# Patient Record
Sex: Female | Born: 1995 | Race: Black or African American | Hispanic: No | Marital: Single | Smoking: Never smoker
Health system: Southern US, Community
[De-identification: ages and names within clinical notes are randomized; demographics above are authoritative.]

## PROBLEM LIST (undated history)

## (undated) ENCOUNTER — Emergency Department (HOSPITAL_COMMUNITY): Payer: Self-pay | Source: Home / Self Care

## (undated) DIAGNOSIS — N83209 Unspecified ovarian cyst, unspecified side: Secondary | ICD-10-CM

## (undated) DIAGNOSIS — Z87440 Personal history of urinary (tract) infections: Secondary | ICD-10-CM

## (undated) HISTORY — PX: NO PAST SURGERIES: SHX2092

---
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
## 2015-03-10 ENCOUNTER — Encounter: Payer: Self-pay | Admitting: Obstetrics & Gynecology

## 2015-03-10 ENCOUNTER — Ambulatory Visit (INDEPENDENT_AMBULATORY_CARE_PROVIDER_SITE_OTHER): Payer: BLUE CROSS/BLUE SHIELD | Admitting: Obstetrics & Gynecology

## 2015-03-10 VITALS — BP 120/73 | HR 75 | Temp 98.4°F | Wt 111.6 lb

## 2015-03-10 DIAGNOSIS — Q6100 Congenital renal cyst, unspecified: Secondary | ICD-10-CM | POA: Diagnosis not present

## 2015-03-10 DIAGNOSIS — D500 Iron deficiency anemia secondary to blood loss (chronic): Secondary | ICD-10-CM

## 2015-03-10 DIAGNOSIS — N281 Cyst of kidney, acquired: Secondary | ICD-10-CM

## 2015-03-10 LAB — CBC
HEMATOCRIT: 31.9 % — AB (ref 36.0–46.0)
HEMOGLOBIN: 10 g/dL — AB (ref 12.0–15.0)
MCH: 26.2 pg (ref 26.0–34.0)
MCHC: 31.3 g/dL (ref 30.0–36.0)
MCV: 83.5 fL (ref 78.0–100.0)
MPV: 8.8 fL (ref 8.6–12.4)
PLATELETS: 438 10*3/uL — AB (ref 150–400)
RBC: 3.82 MIL/uL — ABNORMAL LOW (ref 3.87–5.11)
RDW: 14.9 % (ref 11.5–15.5)
WBC: 5.6 10*3/uL (ref 4.0–10.5)

## 2015-03-10 LAB — BASIC METABOLIC PANEL
BUN: 12 mg/dL (ref 6–23)
CALCIUM: 9.1 mg/dL (ref 8.4–10.5)
CO2: 23 meq/L (ref 19–32)
Chloride: 106 mEq/L (ref 96–112)
Creat: 0.76 mg/dL (ref 0.50–1.10)
Glucose, Bld: 99 mg/dL (ref 70–99)
Potassium: 4.7 mEq/L (ref 3.5–5.3)
SODIUM: 139 meq/L (ref 135–145)

## 2015-03-10 NOTE — Progress Notes (Signed)
Subjective:     Patient ID: Deborah Swanson, female   DOB: 03/22/1996, 19 y.o.   MRN: [id number]  HPI Pt reports that she is here as a post surgery f/u. She denies problems. She reports occ pain with menses but, has no other pain.  She is back to full activitiy.  She also had questions re a renal cyst that was dx'd in Oct of 2014 that was not followed up.   Review of Systems     Objective:   Physical ExamPt in NAD BP 120/73 mmHg  Pulse 75  Temp(Src) 98.4 F (36.9 C) (Oral)  Wt 111 lb 9.6 oz (50.621 kg)  LMP 02/13/2015  Abd: soft, NT; ND.  Well healed transverse incision.    10/11/2013 CLINICAL DATA: Right lower quadrant abdominal pain.  EXAM: CT ABDOMEN AND PELVIS WITH CONTRAST  TECHNIQUE: Multidetector CT imaging of the abdomen and pelvis was performed using the standard protocol following bolus administration of intravenous contrast.  CONTRAST: 50mL OMNIPAQUE IOHEXOL 300 MG/ML SOLN, 100mL OMNIPAQUE IOHEXOL 300 MG/ML SOLN  COMPARISON: None.  FINDINGS: The lung bases are clear. No pleural effusion.  The solid abdominal organs are unremarkable. There is a complex cyst associated with the upper pole region of the left kidney.  The stomach, duodenum, small bowel and colon are unremarkable. The appendix is normal. No mesenteric or retroperitoneal mass or adenopathy. The aorta and branch vessels are normal.  There is a large cystic lesion in the right adnexal area extending well appendage the abdomen. There are areas of enhancing soft tissue density and some septations. This also a large cyst likely arising from the left ovary which appears more simple. The uterus is displaced to the left but is otherwise unremarkable. The endometrium is slightly thickened but this may be due to secretory phase of ovulation. There is a small amount of free pelvic fluid and also fluid in the right paracolic gutter.  The bony structures are  unremarkable.  IMPRESSION: Large (12 x 10 cm) complex cystic lesion associated with the right ovary. This could be a cystadenoma or cystadenocarcinoma.  5 cm simple appearing cyst associated with the left ovary.  Small to moderate amount of free pelvic fluid.  No obvious omental or peritoneal surface disease.  Complex cyst associated with the left ovary. Ultrasound followup suggested.   10/21/2014CLINICAL DATA: Right lower quadrant pelvic pain and mass on prior exam  EXAM: TRANSVAGINAL ULTRASOUND OF PELVIS  DOPPLER ULTRASOUND OF OVARIES  TECHNIQUE: Transvaginal ultrasound examination of the pelvis was performed including evaluation of the uterus, ovaries, adnexal regions, and pelvic cul-de-sac.  Color and duplex Doppler ultrasound was utilized to evaluate blood flow to the ovaries.  COMPARISON: CT and pelvic ultrasound 10/10/2013  FINDINGS: Uterus  Measurements: 9.7 x 4.5 x 4.2 cm. No fibroids or other mass visualized.  Endometrium  Thickness: 1.2 cm. No focal abnormality visualized.  Right ovary  Measurements: No ovarian tissue is identifiable separate from a large right adnexal mass measuring 12.2 x 7.4 x 7.0 cm, as previously seen. There is low resistance arterial waveform identifiable within this mass but venous flow is not identifiable separately. The right ovary is not identifiable separate from this mass.  Left ovary  Measurements: 7.0 x 5.1 x 6.2 cm. Probable hemorrhagic cyst with retractile internal clot measuring 5.1 x 5.1 x 4.5 cm.  Pulsed Doppler evaluation demonstrates normal low resistance arterial and venous waveforms in the left ovary.  Moderate free fluid is noted within the pelvis and right paracolic  gutter.  IMPRESSION: Large right adnexal mass without separately identifiable ovary is reidentified. This could represent primary ovarian malignancy such as malignant dermoid, sarcoma, or less likely fibroma or  thecoma. Internal low resistance arterial waveform is detectable but this could represent neovascularity associated with tumor. Ovarian torsion is possible based on the appearance (ovarian enlargement itself may be a feature of torsion) although the appearance is more visually suggestive of a mass and no peripheral follicles are identified.  Probable left ovarian hemorrhagic cyst. Followup is as per the patient's overall clinical course and recommended in 6-8 weeks with pelvic ultrasound from less additional intervention is performed.  Surgical consultation is recommended for the right adnexal mass. These results were called by telephone at the time of interpretation on 10/13/2013 at 11:07 AM to nurse [name] in the [facility], while the nurse practitioner is temporarily unavailable, who verbally acknowledged these results.  No sonographic evidence for left ovarian torsion. .    Assessment:     Pt for 1 year f/u of LSO.  She is currently sx free.  She had a renal complex cyst on her left kidney that was never eval.  Will obtain renal sono and if abnormal will refer as appropriate.         Plan:     Renal sono F/u in 1 year or sooner prn results of renal sono.

## 2015-03-10 NOTE — Patient Instructions (Signed)
Anemia, Nonspecific Anemia is a condition in which the concentration of red blood cells or hemoglobin in the blood is below normal. Hemoglobin is a substance in red blood cells that carries oxygen to the tissues of the body. Anemia results in not enough oxygen reaching these tissues.  CAUSES  Common causes of anemia include:   Excessive bleeding. Bleeding may be internal or external. This includes excessive bleeding from periods (in women) or from the intestine.   Poor nutrition.   Chronic kidney, thyroid, and liver disease.  Bone marrow disorders that decrease red blood cell production.  Cancer and treatments for cancer.  HIV, AIDS, and their treatments.  Spleen problems that increase red blood cell destruction.  Blood disorders.  Excess destruction of red blood cells due to infection, medicines, and autoimmune disorders. SIGNS AND SYMPTOMS   Minor weakness.   Dizziness.   Headache.  Palpitations.   Shortness of breath, especially with exercise.   Paleness.  Cold sensitivity.  Indigestion.  Nausea.  Difficulty sleeping.  Difficulty concentrating. Symptoms may occur suddenly or they may develop slowly.  DIAGNOSIS  Additional blood tests are often needed. These help your health care provider determine the best treatment. Your health care provider will check your stool for blood and look for other causes of blood loss.  TREATMENT  Treatment varies depending on the cause of the anemia. Treatment can include:   Supplements of iron, vitamin B12, or folic acid.   Hormone medicines.   A blood transfusion. This may be needed if blood loss is severe.   Hospitalization. This may be needed if there is significant continual blood loss.   Dietary changes.  Spleen removal. HOME CARE INSTRUCTIONS Keep all follow-up appointments. It often takes many weeks to correct anemia, and having your health care provider check on your condition and your response to  treatment is very important. SEEK IMMEDIATE MEDICAL CARE IF:   You develop extreme weakness, shortness of breath, or chest pain.   You become dizzy or have trouble concentrating.  You develop heavy vaginal bleeding.   You develop a rash.   You have bloody or black, tarry stools.   You faint.   You vomit up blood.   You vomit repeatedly.   You have abdominal pain.  You have a fever or persistent symptoms for more than 2-3 days.   You have a fever and your symptoms suddenly get worse.   You are dehydrated.  MAKE SURE YOU:  Understand these instructions.  Will watch your condition.  Will get help right away if you are not doing well or get worse. Document Released: 01/17/2005 Document Revised: 08/12/2013 Document Reviewed: 06/05/2013 ExitCare Patient Information 2015 [employer]. This information is not intended to replace advice given to you by your health care provider. Make sure you discuss any questions you have with your health care provider.  

## 2015-03-14 ENCOUNTER — Telehealth: Payer: Self-pay | Admitting: *Deleted

## 2015-03-14 NOTE — Telephone Encounter (Signed)
Attempted to contact patient, no answer, left message for patient to contact the clinic or to indicate that we can leave confidential information on her voicemail.

## 2015-03-14 NOTE — Telephone Encounter (Signed)
-----   Message from Willodean Rosenthalarolyn Harraway-Smith, MD sent at 03/14/2015  9:27 AM EDT ----- Please notify pt that she should be taking FeSO4 OTC bid for anemia.  Thanks, clh-S

## 2015-03-15 NOTE — Telephone Encounter (Signed)
Called patient and informed her of continued low iron levels and to start taking iron twice a day and also reminded patient of appt on 3/24. Patient verbalized understanding to all and had no questions

## 2015-03-17 ENCOUNTER — Ambulatory Visit (HOSPITAL_COMMUNITY)
Admission: RE | Admit: 2015-03-17 | Discharge: 2015-03-17 | Disposition: A | Discharge status: Home / Self Care | Payer: BLUE CROSS/BLUE SHIELD | Source: Ambulatory Visit | Attending: Obstetrics & Gynecology | Admitting: Obstetrics & Gynecology

## 2015-03-17 DIAGNOSIS — N281 Cyst of kidney, acquired: Secondary | ICD-10-CM

## 2015-03-17 DIAGNOSIS — Q6100 Congenital renal cyst, unspecified: Secondary | ICD-10-CM | POA: Diagnosis present

## 2015-03-21 ENCOUNTER — Telehealth: Payer: Self-pay | Admitting: *Deleted

## 2015-03-21 NOTE — Telephone Encounter (Signed)
Called patient and left message to call us back for some results.

## 2015-03-21 NOTE — Telephone Encounter (Signed)
-----   Message from Willodean Rosenthalarolyn Harraway-Smith, MD sent at 03/17/2015  4:56 PM EDT ----- Please call pt.  Her renal sono was normal.  No cyst seen.  Thx, clh-S

## 2015-03-21 NOTE — Telephone Encounter (Signed)
Pt returned call and I informed her or results. She had no further questions.

## 2016-08-04 IMAGING — US US RENAL
1 series · 14 of 25 positions shown · non-contrast
Comparison: CT abdomen pelvis dated 10/10/2013

CLINICAL DATA: Follow-up left renal cysts

EXAM:
RENAL/URINARY TRACT ULTRASOUND COMPLETE

[Series 1: us renal · 14 of 61 slices shown]
[im 1/61]
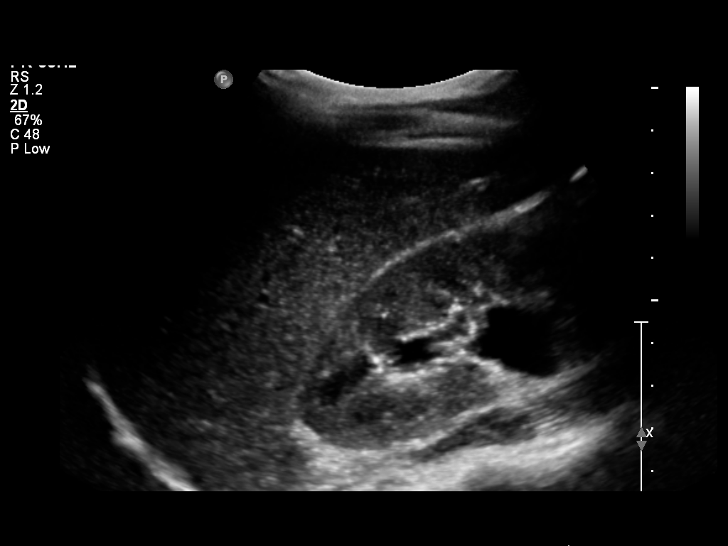
[im 6/61]
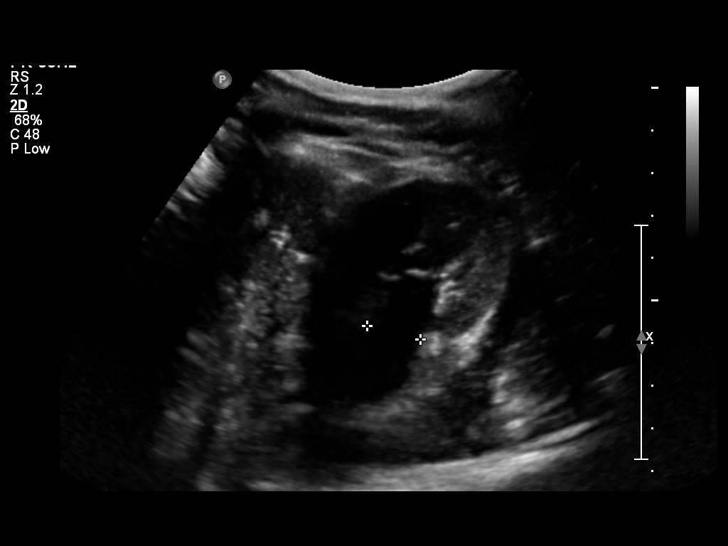
[im 11/61]
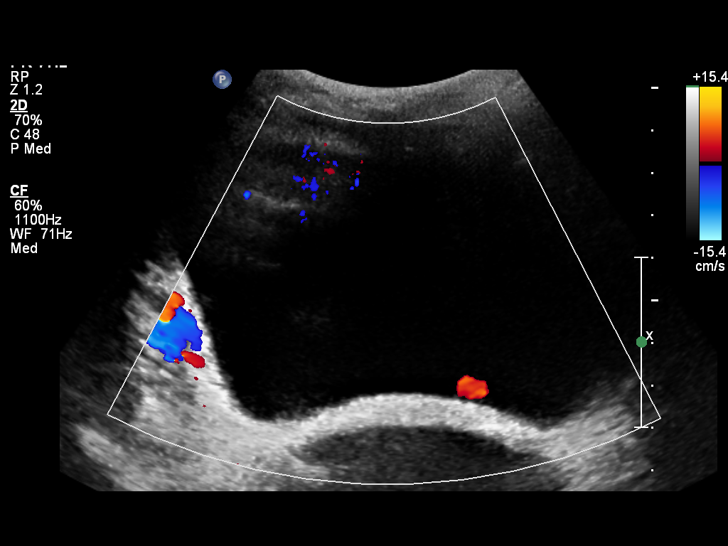
[im 16/61]
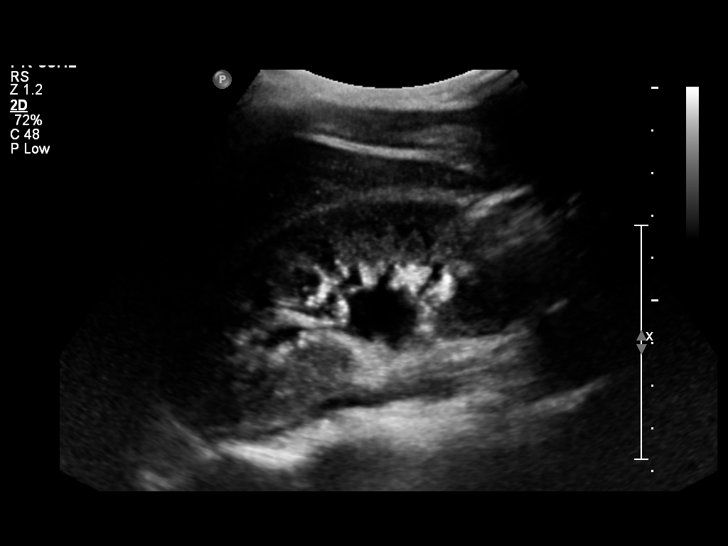
[im 21/61]
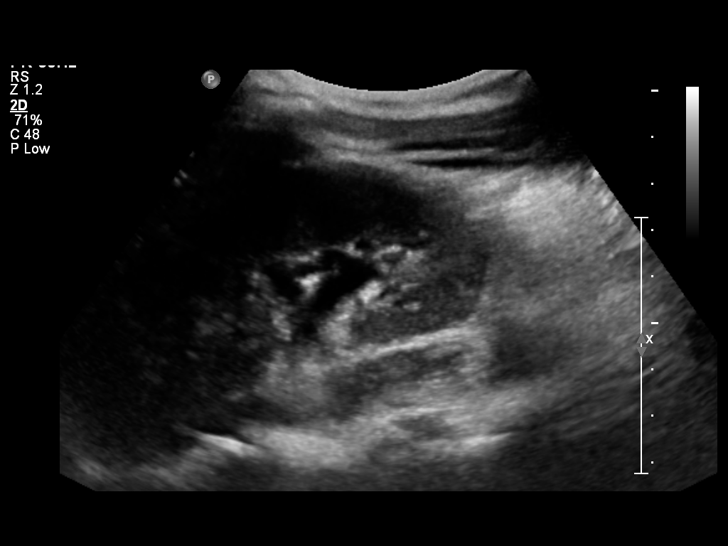
[im 23/61]
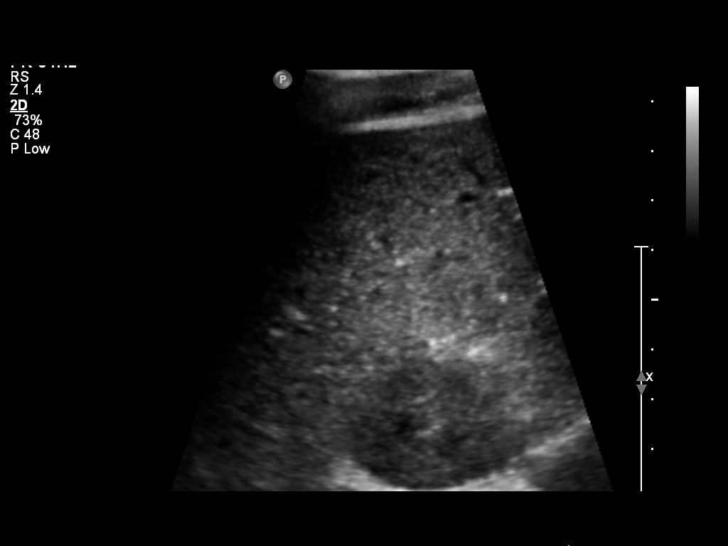
[im 28/61]
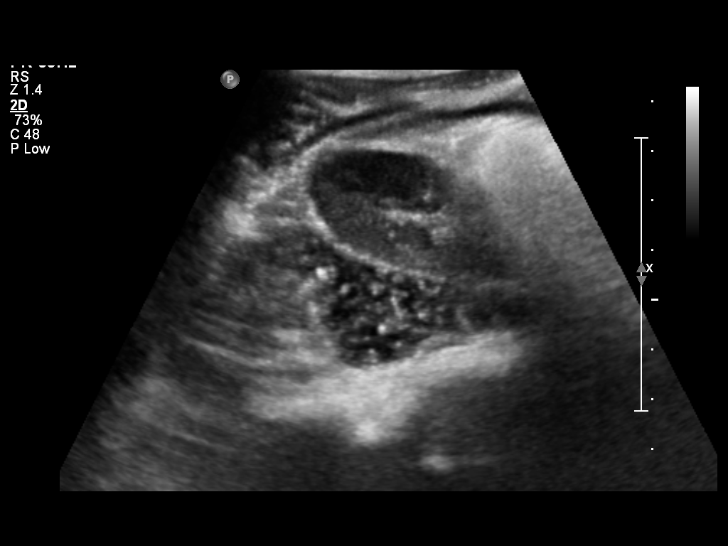
[im 33/61]
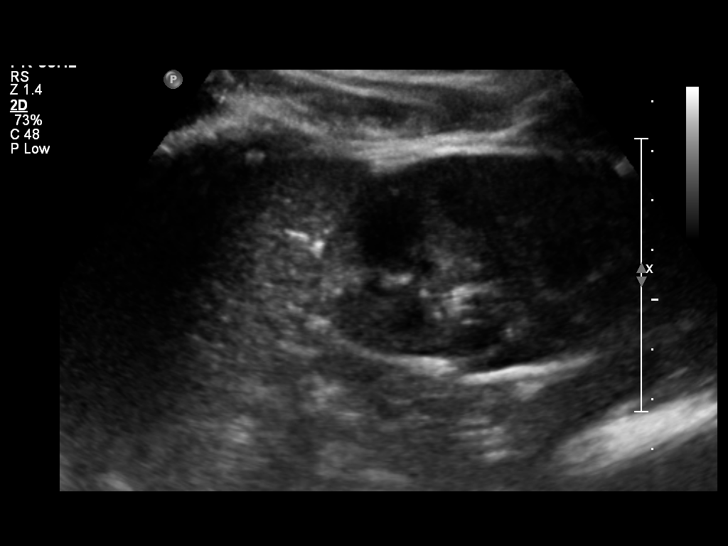
[im 38/61]
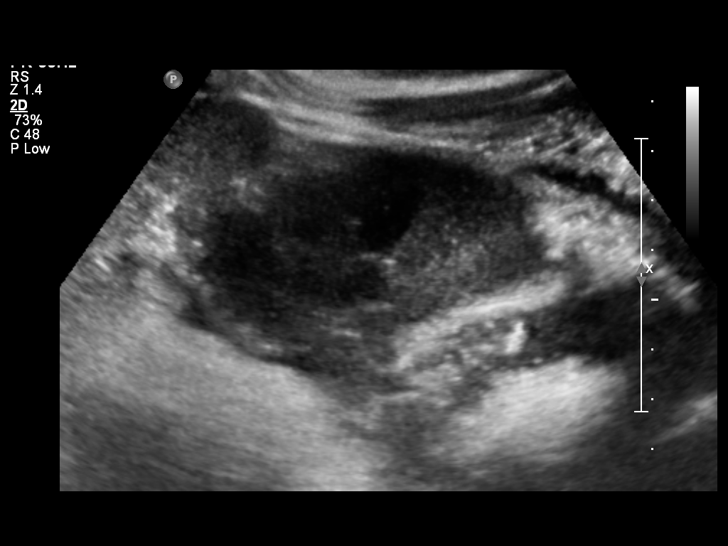
[im 41/61]
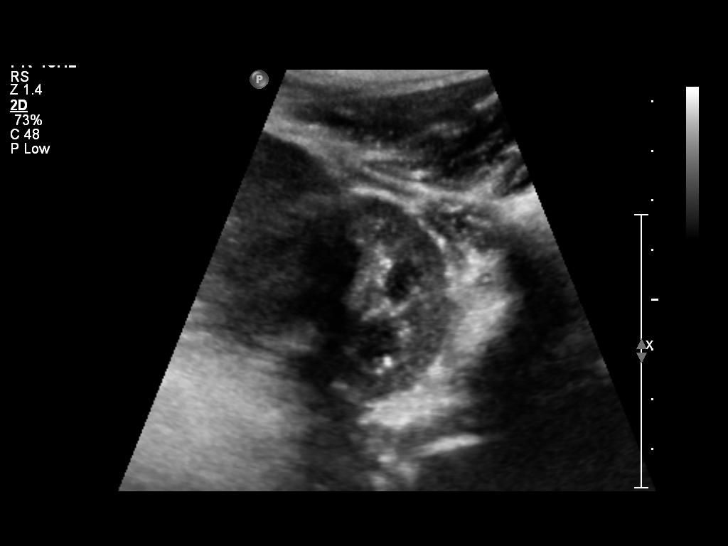
[im 46/61]
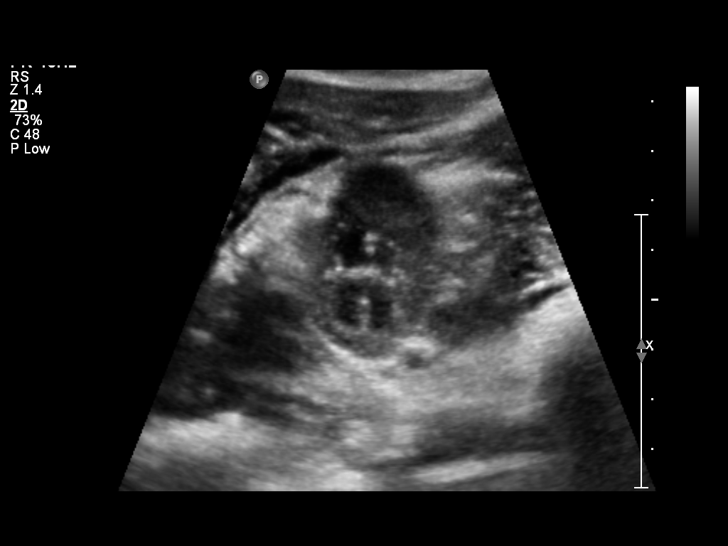
[im 51/61]
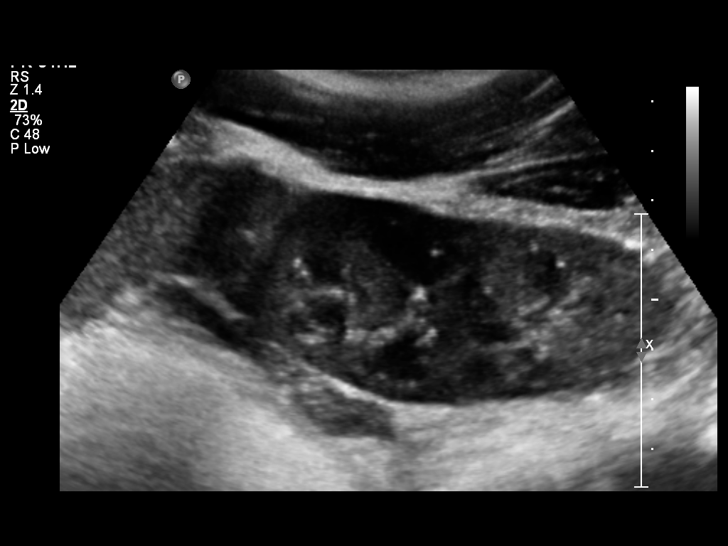
[im 56/61]
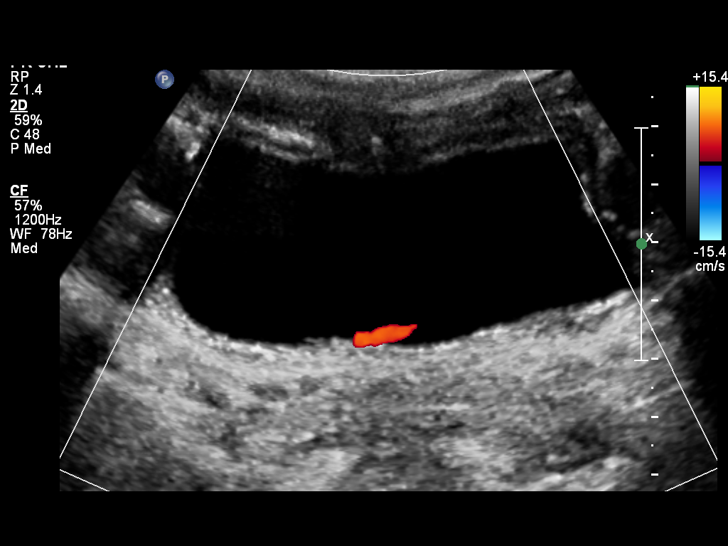
[im 61/61]
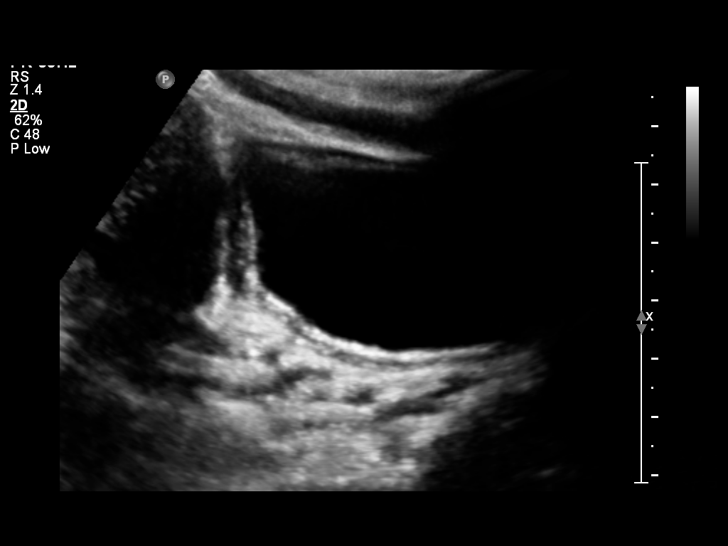

[14 of 25 positions shown; findings below may reference images not displayed]

FINDINGS: Right Kidney:

Length: 9.9 cm.  Mild hydronephrosis.

Left Kidney:

Length: 10.0 cm. Left upper pole cyst on prior CT is not evident on
ultrasound. No hydronephrosis.

Bladder:

Within normal limits.
IMPRESSION: Mild right hydronephrosis.

Left renal cyst on CT is not evident on ultrasound.

## 2019-05-21 DIAGNOSIS — Z23 Encounter for immunization: Secondary | ICD-10-CM | POA: Diagnosis not present

## 2019-05-21 DIAGNOSIS — Z3041 Encounter for surveillance of contraceptive pills: Secondary | ICD-10-CM | POA: Diagnosis not present

## 2019-07-29 DIAGNOSIS — Z111 Encounter for screening for respiratory tuberculosis: Secondary | ICD-10-CM | POA: Diagnosis not present

## 2019-07-31 DIAGNOSIS — Z111 Encounter for screening for respiratory tuberculosis: Secondary | ICD-10-CM | POA: Diagnosis not present

## 2020-02-29 ENCOUNTER — Ambulatory Visit: Payer: BC Managed Care – PPO | Attending: Internal Medicine

## 2020-02-29 DIAGNOSIS — Z23 Encounter for immunization: Secondary | ICD-10-CM | POA: Insufficient documentation

## 2020-02-29 NOTE — Progress Notes (Signed)
   Covid-19 Vaccination Clinic  Name:  Deborah Swanson    MRN: [id number] DOB: 1996-07-19  02/29/2020  [name] was observed post Covid-19 immunization for 15 minutes without incident. She was provided with Vaccine Information Sheet and instruction to access the V-Safe system.   [name] was instructed to call 911 with any severe reactions post vaccine: Marland Kitchen Difficulty breathing  . Swelling of face and throat  . A fast heartbeat  . A bad rash all over body  . Dizziness and weakness   Immunizations Administered    Name Date Dose VIS Date Route   Pfizer COVID-19 Vaccine 02/29/2020  4:27 PM 0.3 mL 12/04/2019 Intramuscular   Manufacturer: [employer], [employer]   Lot: [id number]   NDC: [address]-0

## 2020-03-28 ENCOUNTER — Ambulatory Visit: Payer: BC Managed Care – PPO

## 2020-04-06 ENCOUNTER — Ambulatory Visit: Payer: Self-pay | Attending: Internal Medicine

## 2020-04-06 DIAGNOSIS — Z23 Encounter for immunization: Secondary | ICD-10-CM

## 2020-04-06 NOTE — Progress Notes (Signed)
   Covid-19 Vaccination Clinic  Name:  Deborah Swanson    MRN: [id number] DOB: 11/29/1996  04/06/2020  [name] was observed post Covid-19 immunization for 15 minutes without incident. She was provided with Vaccine Information Sheet and instruction to access the V-Safe system.   [name] was instructed to call 911 with any severe reactions post vaccine: Marland Kitchen Difficulty breathing  . Swelling of face and throat  . A fast heartbeat  . A bad rash all over body  . Dizziness and weakness   Immunizations Administered    Name Date Dose VIS Date Route   Pfizer COVID-19 Vaccine 04/06/2020 10:53 AM 0.3 mL 12/04/2019 Intramuscular   Manufacturer: [employer], [employer]   Lot: [id number]   NDC: [address]-2
